# Patient Record
Sex: Female | Born: 2000 | Race: Asian | Hispanic: No | Smoking: Never smoker
Health system: Southern US, Community
[De-identification: ages and names within clinical notes are randomized; demographics above are authoritative.]

---
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
## 2022-03-19 DIAGNOSIS — J302 Other seasonal allergic rhinitis: Secondary | ICD-10-CM | POA: Diagnosis not present

## 2022-03-19 DIAGNOSIS — F341 Dysthymic disorder: Secondary | ICD-10-CM | POA: Diagnosis not present

## 2022-03-19 DIAGNOSIS — Z79899 Other long term (current) drug therapy: Secondary | ICD-10-CM | POA: Diagnosis not present

## 2022-04-28 ENCOUNTER — Ambulatory Visit: Payer: BC Managed Care – PPO | Admitting: Adult Health

## 2022-05-21 ENCOUNTER — Ambulatory Visit: Payer: BC Managed Care – PPO | Admitting: Adult Health

## 2022-05-26 ENCOUNTER — Encounter: Payer: Self-pay | Admitting: Adult Health

## 2022-05-26 ENCOUNTER — Ambulatory Visit (INDEPENDENT_AMBULATORY_CARE_PROVIDER_SITE_OTHER): Payer: BC Managed Care – PPO | Admitting: Adult Health

## 2022-05-26 VITALS — BP 124/87 | HR 98 | Ht 64.0 in | Wt 170.0 lb

## 2022-05-26 DIAGNOSIS — F3281 Premenstrual dysphoric disorder: Secondary | ICD-10-CM | POA: Diagnosis not present

## 2022-05-26 DIAGNOSIS — F902 Attention-deficit hyperactivity disorder, combined type: Secondary | ICD-10-CM

## 2022-05-26 MED ORDER — AMPHETAMINE-DEXTROAMPHET ER 10 MG PO CP24: 10.0000 mg | ORAL_CAPSULE | Freq: Every day | ORAL | 0 refills | Status: DC

## 2022-05-26 NOTE — Progress Notes (Signed)
[facility] MD/PA/NP Initial Note  05/26/2022 2:04 PM V-Raya Nattaly Musgrove  MRN:  [id number]  Chief Complaint:   HPI:   Patient seen today for initial psychiatric evaluation.   Parents attending planning part of interview. Willing to consider a trial of Adderall for 4 weeks.   Referred by PCP for an ADD evaluation.  Describes mood today as "ok". Pleasant. Denies tearfulness. Mood symptoms - reports depression, anxiety, and irritability. Reports worry, rumination, and irritability. Denies obsessive thoughts or acts - "I like things in order". Mood is consistent - denies any fluctuations. Started Celexa '20mg'$  a year ago, but has not been compliant with taking the medication daily - typically taking 3 times a week - trying to be more consistent, Reports difficulties with focus and concentration since grade chool. Reports "struggling" with certain classes throughout her academic career, but has mostly maintained grades at a "B or C" average. Since staring college she feels her grades have suffered. Reports starting at [employer] in 2021 and has difficulties maintaining passing grades. She is currently on academic probation and if her grades do not improve this semester, she will be dismissed from the university". She is working with an Risk analyst - was given less challenging classes this semester. She has also spoken with her PCP about a possible ADD diagnosis. She has not been formally tested for ADD, but feels she may meet criteria. Is also open to have a psychological evaluation. Stable interest and motivation. Taking medications as prescribed.  Energy levels "medium". Active, does not have a regular exercise routine.   Enjoys some usual interests and activities. Dating. Has a boyfriend of 3 years. Lives with her parents and brother 60 Garment/textile technologist). Spending time with family. Talking with friends. Appetite adequate. Weight stable. Sleeps well most nights. Averages 7 to 8 hours. Focus and  concentration stable. Completing tasks. Managing aspects of household. 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 Does not work. Denies SI or HI.  Denies AH or VH. Denies self harm. Denies substance use.  Previous medication trials: Celexa '20mg'$  daily  Visit Diagnosis:    ICD-10-CM   1. PMDD (premenstrual dysphoric disorder)  F32.81     2. Attention deficit hyperactivity disorder (ADHD), combined type  F90.2 amphetamine-dextroamphetamine (ADDERALL XR) 10 MG 24 hr capsule      Past Psychiatric History: Denies psychiatric hospitalization.   Past Medical History: History reviewed. No pertinent past medical history. History reviewed. No pertinent surgical history.  Family Psychiatric History: Denies any family history of mental illness.   Family History: History reviewed. No pertinent family history.  Social History:  Social History   Socioeconomic History   Marital status: Single    Spouse name: Not on file   Number of children: Not on file   Years of education: Not on file   Highest education level: Not on file  Occupational History   Not on file  Tobacco Use   Smoking status: Never   Smokeless tobacco: Never  Substance and Sexual Activity   Alcohol use: Not on file   Drug use: Not on file   Sexual activity: Not on file  Other Topics Concern   Not on file  Social History Narrative   Not on file   Social Determinants of Health   Financial Resource Strain: Not on file  Food Insecurity: Not on file  Transportation Needs: Not on file  Physical Activity: Not on file  Stress: Not on file  Social Connections: Not on file    Allergies:  Allergies  Allergen Reactions   Doxycycline Other (See Comments)    Metabolic Disorder Labs: No results found for: "HGBA1C", "MPG" No results found for: "PROLACTIN" No results found for: "CHOL", "TRIG", "HDL", "CHOLHDL", "VLDL", "LDLCALC" No results found for: "TSH"  Therapeutic Level Labs: No results found for: "LITHIUM" No results  found for: "VALPROATE" No results found for: "CBMZ"  Current Medications: Current Outpatient Medications  Medication Sig Dispense Refill   amphetamine-dextroamphetamine (ADDERALL XR) 10 MG 24 hr capsule Take 1 capsule (10 mg total) by mouth daily. 30 capsule 0   citalopram (CELEXA) 20 MG tablet Take 20 mg by mouth daily.     No current facility-administered medications for this visit.    Medication Side Effects: none  Orders placed this visit:  No orders of the defined types were placed in this encounter.   Psychiatric Specialty Exam:  Review of Systems  Musculoskeletal:  Negative for gait problem.  Neurological:  Negative for tremors.  Psychiatric/Behavioral:         Please refer to HPI    Blood pressure 124/87, pulse 98, height '5\' 4"'$  (1.626 m), weight 170 lb (77.1 kg).Body mass index is 29.18 kg/m.  General Appearance: Casual and Neat  Eye Contact:  Good  Speech:  Clear and Coherent and Normal Rate  Volume:  Normal  Mood:  Euthymic  Affect:  Appropriate and Congruent  Thought Process:  Coherent and Descriptions of Associations: Intact  Orientation:  Full (Time, Place, and Person)  Thought Content: Logical   Suicidal Thoughts:  No  Homicidal Thoughts:  No  Memory:  WNL  Judgement:  Good  Insight:  Good  Psychomotor Activity:  Normal  Concentration:  Concentration: difficulties  and Attention Span: difficulties  Recall:  Good  Fund of Knowledge: Good  Language: Good  Assets:  Communication Skills Desire for Improvement Financial Resources/Insurance Housing Intimacy Leisure Time Physical Health Resilience Social Support Talents/Skills Transportation Vocational/Educational  ADL's:  Intact  Cognition: WNL  Prognosis:  Good   Screenings: MDQ, ADD screening  Receiving Psychotherapy: No   Treatment Plan/Recommendations:  Plan:  PDMP reviewed  Celexa '20mg'$  daily x 1 year - needs to take routinely - suggested ideas to help remember.  Add Adderall XR  '10mg'$  every morning.  ADD screening tool used to assess possible ADD. Her score was 35/58 indicating a possible ADD diagnosis.  RTC 4 weeks  Patient advised to contact office with any questions, adverse effects, or acute worsening in signs and symptoms.   Refer to [facility] or [employer] for more formal testing.  Aloha Gell, NP

## 2022-06-14 ENCOUNTER — Ambulatory Visit: Payer: BC Managed Care – PPO | Admitting: Behavioral Health

## 2022-06-23 ENCOUNTER — Encounter: Payer: Self-pay | Admitting: Adult Health

## 2022-06-23 ENCOUNTER — Ambulatory Visit (INDEPENDENT_AMBULATORY_CARE_PROVIDER_SITE_OTHER): Payer: BC Managed Care – PPO | Admitting: Adult Health

## 2022-06-23 DIAGNOSIS — F3281 Premenstrual dysphoric disorder: Secondary | ICD-10-CM | POA: Diagnosis not present

## 2022-06-23 DIAGNOSIS — F902 Attention-deficit hyperactivity disorder, combined type: Secondary | ICD-10-CM

## 2022-06-23 MED ORDER — AMPHETAMINE-DEXTROAMPHET ER 10 MG PO CP24: 10.0000 mg | ORAL_CAPSULE | Freq: Every day | ORAL | 0 refills | Status: AC

## 2022-06-23 NOTE — Progress Notes (Signed)
Susan Schwartz [id number] [birth year] 22 y.o.  Subjective:   Patient ID:  Susan Schwartz is a 22 y.o. (DOB [birth year]) female.  Chief Complaint: No chief complaint on file.   HPI [place] presents to the office today for follow-up of ADD and PMDD  Describes mood today as "ok". Pleasant. Denies tearfulness. Mood symptoms - reports depression, anxiety, and irritability - has stopped Celexa and does not want to restart. Reports worry, rumination, and over thinking. Denies obsessive thoughts or acts. Mood is consistent. Feels like the addition of Adderall has been helpful. Reports decreased issues with focus and concentration. Feels like she is doing better with school work - working with an Risk analyst. Stable interest and motivation. Taking medications as prescribed.  Energy levels improved. Active, does not have a regular exercise routine.   Enjoys some usual interests and activities. Dating. Has a boyfriend of 3 years. Lives with her parents and brother 7 Garment/textile technologist). Spending time with family. Talking with friends. Appetite adequate. Weight stable. Sleeps well most nights. Averages 7 hours. Focus and concentration improved. Completing tasks. Managing aspects of household. 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   Denies SI or HI.  Denies AH or VH. Denies self harm. Denies substance use.  Previous medication trials: Celexa 20mg  daily     Review of Systems:  Review of Systems  Musculoskeletal:  Negative for gait problem.  Neurological:  Negative for tremors.  Psychiatric/Behavioral:         Please refer to HPI    Medications: I have reviewed the patient's current medications.  Current Outpatient Medications  Medication Sig Dispense Refill   amphetamine-dextroamphetamine (ADDERALL XR) 10 MG 24 hr capsule Take 1 capsule (10 mg total) by mouth daily. 30 capsule 0   citalopram (CELEXA) 20 MG tablet Take 20 mg by mouth daily.     No current facility-administered  medications for this visit.    Medication Side Effects: None  Allergies:  Allergies  Allergen Reactions   Doxycycline Other (See Comments)    No past medical history on file.  Past Medical History, Surgical history, Social history, and Family history were reviewed and updated as appropriate.   Please see review of systems for further details on the patient's review from today.   Objective:   Physical Exam:  There were no vitals taken for this visit.  Physical Exam Constitutional:      General: She is not in acute distress. Musculoskeletal:        General: No deformity.  Neurological:     Mental Status: She is alert and oriented to person, place, and time.     Coordination: Coordination normal.  Psychiatric:        Attention and Perception: Attention and perception normal. She does not perceive auditory or visual hallucinations.        Mood and Affect: Mood normal. Mood is not anxious or depressed. Affect is not labile, blunt, angry or inappropriate.        Speech: Speech normal.        Behavior: Behavior normal.        Thought Content: Thought content normal. Thought content is not paranoid or delusional. Thought content does not include homicidal or suicidal ideation. Thought content does not include homicidal or suicidal plan.        Cognition and Memory: Cognition and memory normal.        Judgment: Judgment normal.     Comments: Insight intact     Lab Review:  No results found for: "NA", "K", "CL", "CO2", "GLUCOSE", "BUN", "CREATININE", "CALCIUM", "PROT", "ALBUMIN", "AST", "ALT", "ALKPHOS", "BILITOT", "GFRNONAA", "GFRAA"  No results found for: "WBC", "RBC", "HGB", "HCT", "PLT", "MCV", "MCH", "MCHC", "RDW", "LYMPHSABS", "MONOABS", "EOSABS", "BASOSABS"  No results found for: "POCLITH", "LITHIUM"   No results found for: "PHENYTOIN", "PHENOBARB", "VALPROATE", "CBMZ"   .res Assessment: Plan:    Plan:  PDMP reviewed  D/C Celexa 20mg  daily - not taking   Add  Adderall XR 10mg  every morning.  ADD screening tool used to assess possible ADD. Her score was 35/58 indicating a possible ADD diagnosis.  RTC 4 weeks  Patient advised to contact office with any questions, adverse effects, or acute worsening in signs and symptoms.   Refer to [facility] or [employer] [facility] for more formal testing. There are no diagnoses linked to this encounter.   Please see After Visit Summary for patient specific instructions.  Future Appointments  Date Time Provider [place]  06/23/2022  2:00 PM Earnestine Tuohey, Berdie Ogren, NP CP-CP None  07/02/2022  8:00 AM [place], [facility] CP-CP None    No orders of the defined types were placed in this encounter.   -------------------------------

## 2022-07-02 ENCOUNTER — Ambulatory Visit (INDEPENDENT_AMBULATORY_CARE_PROVIDER_SITE_OTHER): Payer: BC Managed Care – PPO | Admitting: Mental Health

## 2022-07-02 DIAGNOSIS — F902 Attention-deficit hyperactivity disorder, combined type: Secondary | ICD-10-CM | POA: Diagnosis not present
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
## 2022-07-26 ENCOUNTER — Ambulatory Visit: Payer: BC Managed Care – PPO | Admitting: Adult Health

## 2022-07-28 ENCOUNTER — Ambulatory Visit: Payer: BC Managed Care – PPO | Admitting: Mental Health

## 2022-07-28 DIAGNOSIS — F902 Attention-deficit hyperactivity disorder, combined type: Secondary | ICD-10-CM
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
## 2022-08-04 ENCOUNTER — Telehealth: Payer: Self-pay | Admitting: Adult Health

## 2022-08-04 NOTE — Telephone Encounter (Signed)
Patient lvm requesting documentation on letter head of her past sessions and dx. 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 This is needed by 08/11/22. Patient was last seen by [facility] 3/27 and Thayer Ohm on 5/1. She was also seen by [place]. No follow up scheduled.  Contact # [phone]

## 2022-08-05 NOTE — Telephone Encounter (Signed)
Noted thank you

## 2022-08-05 NOTE — Telephone Encounter (Signed)
Asked patient if he had a form from school and he said he did not. He wants a list of appts with Gina/Chris and the diagnoses. I asked if letter needed to be addressed to anyone, did it need to be mailed, etc. He said they want letter in PDF format. Mail to him and he can scan. Needed by 5/15.

## 2022-08-06 NOTE — Telephone Encounter (Signed)
Letter completed, will get put in the mail for the patient

## 2022-09-05 DIAGNOSIS — R509 Fever, unspecified: Secondary | ICD-10-CM | POA: Diagnosis not present

## 2022-09-05 DIAGNOSIS — Z03818 Encounter for observation for suspected exposure to other biological agents ruled out: Secondary | ICD-10-CM | POA: Diagnosis not present

## 2022-09-05 DIAGNOSIS — R52 Pain, unspecified: Secondary | ICD-10-CM | POA: Diagnosis not present

## 2022-09-10 DIAGNOSIS — J069 Acute upper respiratory infection, unspecified: Secondary | ICD-10-CM | POA: Diagnosis not present

## 2022-09-14 DIAGNOSIS — H5213 Myopia, bilateral: Secondary | ICD-10-CM | POA: Diagnosis not present

## 2023-06-24 DIAGNOSIS — Z23 Encounter for immunization: Secondary | ICD-10-CM | POA: Diagnosis not present

## 2023-06-24 DIAGNOSIS — Z131 Encounter for screening for diabetes mellitus: Secondary | ICD-10-CM | POA: Diagnosis not present

## 2023-06-24 DIAGNOSIS — Z0000 Encounter for general adult medical examination without abnormal findings: Secondary | ICD-10-CM | POA: Diagnosis not present

## 2023-06-24 DIAGNOSIS — Z13220 Encounter for screening for lipoid disorders: Secondary | ICD-10-CM | POA: Diagnosis not present

## 2023-08-24 DIAGNOSIS — Z23 Encounter for immunization: Secondary | ICD-10-CM | POA: Diagnosis not present

## 2023-12-19 DIAGNOSIS — Z23 Encounter for immunization: Secondary | ICD-10-CM | POA: Diagnosis not present

## 2024-01-01 ENCOUNTER — Emergency Department (HOSPITAL_BASED_OUTPATIENT_CLINIC_OR_DEPARTMENT_OTHER)
Admission: EM | Admit: 2024-01-01 | Discharge: 2024-01-01 | Disposition: A | Discharge status: Home / Self Care | Payer: Self-pay | Attending: Emergency Medicine | Admitting: Emergency Medicine

## 2024-01-01 ENCOUNTER — Emergency Department (HOSPITAL_BASED_OUTPATIENT_CLINIC_OR_DEPARTMENT_OTHER): Payer: Self-pay

## 2024-01-01 ENCOUNTER — Encounter (HOSPITAL_BASED_OUTPATIENT_CLINIC_OR_DEPARTMENT_OTHER): Payer: Self-pay

## 2024-01-01 ENCOUNTER — Other Ambulatory Visit: Payer: Self-pay

## 2024-01-01 DIAGNOSIS — N390 Urinary tract infection, site not specified: Secondary | ICD-10-CM | POA: Diagnosis not present

## 2024-01-01 DIAGNOSIS — R000 Tachycardia, unspecified: Secondary | ICD-10-CM | POA: Diagnosis not present

## 2024-01-01 DIAGNOSIS — N12 Tubulo-interstitial nephritis, not specified as acute or chronic: Secondary | ICD-10-CM | POA: Diagnosis not present

## 2024-01-01 DIAGNOSIS — R300 Dysuria: Secondary | ICD-10-CM | POA: Diagnosis not present

## 2024-01-01 DIAGNOSIS — R509 Fever, unspecified: Secondary | ICD-10-CM | POA: Diagnosis not present

## 2024-01-01 DIAGNOSIS — R1031 Right lower quadrant pain: Secondary | ICD-10-CM | POA: Diagnosis not present

## 2024-01-01 LAB — URINALYSIS, ROUTINE W REFLEX MICROSCOPIC
Bilirubin Urine: NEGATIVE
Glucose, UA: NEGATIVE mg/dL
Ketones, ur: 15 mg/dL — AB
Nitrite: NEGATIVE
Protein, ur: 100 mg/dL — AB
Specific Gravity, Urine: 1.033 — ABNORMAL HIGH (ref 1.005–1.030)
WBC, UA: >50 WBC/hpf (ref 0–5)
pH: 6 (ref 5.0–8.0)

## 2024-01-01 LAB — COMPREHENSIVE METABOLIC PANEL WITH GFR
ALT: 13 U/L (ref 0–44)
AST: 15 U/L (ref 15–41)
Albumin: 4.3 g/dL (ref 3.5–5.0)
Alkaline Phosphatase: 31 U/L — ABNORMAL LOW (ref 38–126)
Anion gap: 12 (ref 5–15)
BUN: 8 mg/dL (ref 6–20)
CO2: 24 mmol/L (ref 22–32)
Calcium: 9.8 mg/dL (ref 8.9–10.3)
Chloride: 100 mmol/L (ref 98–111)
Creatinine, Ser: 0.76 mg/dL (ref 0.44–1.00)
GFR, Estimated: >60 mL/min (ref >60)
Glucose, Bld: 114 mg/dL — ABNORMAL HIGH (ref 70–99)
Potassium: 3.5 mmol/L (ref 3.5–5.1)
Sodium: 136 mmol/L (ref 135–145)
Total Bilirubin: 0.7 mg/dL (ref 0.0–1.2)
Total Protein: 8.1 g/dL (ref 6.5–8.1)

## 2024-01-01 LAB — LIPASE, BLOOD: Lipase: 16 U/L (ref 11–51)

## 2024-01-01 LAB — CBC WITH DIFFERENTIAL/PLATELET
Abs Immature Granulocytes: 0.08 K/uL — ABNORMAL HIGH (ref 0.00–0.07)
Basophils Absolute: 0 K/uL (ref 0.0–0.1)
Basophils Relative: 0 %
Eosinophils Absolute: 0 K/uL (ref 0.0–0.5)
Eosinophils Relative: 0 %
HCT: 34 % — ABNORMAL LOW (ref 36.0–46.0)
Hemoglobin: 11.8 g/dL — ABNORMAL LOW (ref 12.0–15.0)
Immature Granulocytes: 0 %
Lymphocytes Relative: 6 %
Lymphs Abs: 1.1 K/uL (ref 0.7–4.0)
MCH: 26 pg (ref 26.0–34.0)
MCHC: 34.7 g/dL (ref 30.0–36.0)
MCV: 75.1 fL — ABNORMAL LOW (ref 80.0–100.0)
Monocytes Absolute: 1.4 K/uL — ABNORMAL HIGH (ref 0.1–1.0)
Monocytes Relative: 8 %
Neutro Abs: 15.3 K/uL — ABNORMAL HIGH (ref 1.7–7.7)
Neutrophils Relative %: 86 %
Platelets: 270 K/uL (ref 150–400)
RBC: 4.53 MIL/uL (ref 3.87–5.11)
RDW: 14.3 % (ref 11.5–15.5)
WBC: 18 K/uL — ABNORMAL HIGH (ref 4.0–10.5)
nRBC: 0 % (ref 0.0–0.2)

## 2024-01-01 LAB — PREGNANCY, URINE: Preg Test, Ur: NEGATIVE

## 2024-01-01 MED ORDER — CEFTRIAXONE SODIUM 1 G IJ SOLR
1.0000 g | Freq: Once | INTRAMUSCULAR | Status: AC
  Administered 2024-01-01: 1 g via INTRAVENOUS
  Filled 2024-01-01: qty 10

## 2024-01-01 MED ORDER — SODIUM CHLORIDE 0.9 % IV BOLUS
1000.0000 mL | Freq: Once | INTRAVENOUS | Status: AC
  Administered 2024-01-01: 1000 mL via INTRAVENOUS

## 2024-01-01 MED ORDER — IOHEXOL 300 MG/ML  SOLN
100.0000 mL | Freq: Once | INTRAMUSCULAR | Status: AC | PRN
  Administered 2024-01-01: 100 mL via INTRAVENOUS

## 2024-01-01 NOTE — ED Triage Notes (Addendum)
 Arrives ambulatory to the ED with complaints of fever (high of 103F) and abdominal pain x3 days. Patient went to a walk-in clinic today and diagnosed with a UTI. Sent here to rule out appendicitis.

## 2024-01-01 NOTE — ED Notes (Signed)
 Patient transported to CT

## 2024-01-01 NOTE — ED Provider Notes (Signed)
 [place] EMERGENCY DEPARTMENT AT [facility]  Provider Note  CSN: [id number] Arrival date & time: 01/01/24 1026  History Chief Complaint  Patient presents with   Abdominal Pain   Fever    Susan Schwartz is a 23 y.o. adult FTM transgender with female anatomy reports 2 days of fever, RLQ abdominal pain and dysuria. Seen at [facility] walk-in and diagnosed with UTI but sent to ED for concern of appendicitis. No vomiting.    Home Medications Prior to Admission medications   Medication Sig Start Date End Date Taking? Authorizing Provider  amphetamine -dextroamphetamine (ADDERALL XR) 10 MG 24 hr capsule Take 1 capsule (10 mg total) by mouth daily. 06/23/22   Mozingo, Regina Nattalie, NP     Allergies    Doxycycline   Review of Systems   Review of Systems Please see HPI for pertinent positives and negatives  Physical Exam BP 115/81   Pulse (!) 122   Temp 100 F (37.8 C)   Resp 20   Ht 5' 4 (1.626 m)   Wt 79.4 kg   SpO2 98%   BMI 30.04 kg/m   Physical Exam Vitals and nursing note reviewed.  Constitutional:      Appearance: Normal appearance.  HENT:     Head: Normocephalic and atraumatic.     Nose: Nose normal.     Mouth/Throat:     Mouth: Mucous membranes are moist.  Eyes:     Extraocular Movements: Extraocular movements intact.     Conjunctiva/sclera: Conjunctivae normal.  Cardiovascular:     Rate and Rhythm: Normal rate.  Pulmonary:     Effort: Pulmonary effort is normal.     Breath sounds: Normal breath sounds.  Abdominal:     General: Abdomen is flat.     Palpations: Abdomen is soft.     Tenderness: There is abdominal tenderness (RLQ). There is no guarding.  Musculoskeletal:        General: No swelling. Normal range of motion.     Cervical back: Neck supple.  Skin:    General: Skin is warm and dry.  Neurological:     General: No focal deficit present.     Mental Status: He is alert.  Psychiatric:        Mood and Affect: Mood normal.      ED Results / Procedures / Treatments   EKG EKG Interpretation Date/Time:  Sunday January 01 2024 10:40:21 EDT Ventricular Rate:  121 PR Interval:  142 QRS Duration:  89 QT Interval:  320 QTC Calculation: 454 R Axis:   75  Text Interpretation: Sinus tachycardia Nonspecific T abnormalities, diffuse leads Baseline wander in lead(s) I II III aVR No old tracing to compare Confirmed by Roselyn Dunnings [phone]) on 01/01/2024 11:06:19 AM  Procedures Procedures  Medications Ordered in the ED Medications  sodium chloride 0.9 % bolus 1,000 mL (1,000 mLs Intravenous New Bag/Given 01/01/24 1127)  cefTRIAXone (ROCEPHIN) 1 g in sodium chloride 0.9 % 100 mL IVPB (1 g Intravenous New Bag/Given 01/01/24 1158)  iohexol (OMNIPAQUE) 300 MG/ML solution 100 mL (100 mLs Intravenous Contrast Given 01/01/24 1205)    Initial Impression and Plan  Patient here for RLQ pain and fever, some dysuria as well. Will check labs, send for CT.   ED Course   Clinical Course as of 01/01/24 1253  Sun Jan 01, 2024  1141 CBC with leukocytosis.  [CS]  1152 UA is consistent with UTI. Rocephin ordered.  [CS]  1203 HCG is neg. CMP and  lipase are neg.  [CS]  1225 I personally viewed the images from radiology studies and agree with radiologist interpretation: CT neg for appendicitis, stranding consistent with developing pyelonephritis. Plan discharge after Abx with Keflex prescribed by [facility], returned precautions discussed.  [CS]    Clinical Course User Index [CS] Roselyn Carlin NOVAK, MD     MDM Rules/Calculators/A&P Medical Decision Making Given presenting complaint, I considered that admission might be necessary. After review of results from ED lab and/or imaging studies, admission to the hospital is not indicated at this time.    Problems Addressed: Pyelonephritis: acute illness or injury  Amount and/or Complexity of Data Reviewed Labs: ordered. Decision-making details documented in ED  Course. Radiology: ordered and independent interpretation performed. Decision-making details documented in ED Course.  Risk Prescription drug management. Decision regarding hospitalization.     Final Clinical Impression(s) / ED Diagnoses Final diagnoses:  Pyelonephritis    Rx / DC Orders ED Discharge Orders     None        Roselyn Carlin NOVAK, MD 01/01/24 1253

## 2024-01-03 DIAGNOSIS — R300 Dysuria: Secondary | ICD-10-CM | POA: Diagnosis not present

## 2024-01-03 DIAGNOSIS — N390 Urinary tract infection, site not specified: Secondary | ICD-10-CM | POA: Diagnosis not present

## 2024-01-03 DIAGNOSIS — R509 Fever, unspecified: Secondary | ICD-10-CM | POA: Diagnosis not present

## 2024-01-03 LAB — URINE CULTURE: Culture: >=100000 — AB

## 2024-01-04 ENCOUNTER — Telehealth (HOSPITAL_BASED_OUTPATIENT_CLINIC_OR_DEPARTMENT_OTHER): Payer: Self-pay

## 2024-01-04 NOTE — Telephone Encounter (Signed)
 Post ED Visit - Positive Culture Follow-up  Culture report reviewed by antimicrobial stewardship pharmacist: [facility] [x]  Amon Rocher, Pharm.D. []  Venetia Gully, Pharm.D., BCPS AQ-ID []  Garrel Crews, Pharm.D., BCPS []  Almarie Lunger, Pharm.D., BCPS []  [place], [address].D., BCPS, AAHIVP []  Rosaline Bihari, Pharm.D., BCPS, AAHIVP []  Vernell Meier, PharmD, BCPS []  Latanya Hint, PharmD, BCPS []  Donald Medley, PharmD, BCPS []  Rocky Bold, PharmD []  Dorothyann Alert, PharmD, BCPS []  Morene Babe, PharmD  [facility] []  Rosaline Edison, PharmD []  Romona Bliss, PharmD []  Dolphus Roller, PharmD []  Veva Seip, Rph []  Vernell Daunt) Leonce, PharmD []  Eva Allis, PharmD []  Rosaline Millet, PharmD []  Iantha Batch, PharmD []  Arvin Gauss, PharmD []  Wanda Hasting, PharmD []  Ronal Rav, PharmD []  Rocky Slade, PharmD []  Bard Jeans, PharmD   Positive urine culture Treated with Keflex, organism sensitive to the same and no further patient follow-up is required at this time.  Susan Schwartz 01/04/2024, 11:08 AM
